# Patient Record
Sex: Female | Born: 1990 | Race: Black or African American | Hispanic: No | Marital: Single | Smoking: Never smoker
Health system: Southern US, Community
[De-identification: ages and names within clinical notes are randomized; demographics above are authoritative.]

## PROBLEM LIST (undated history)

## (undated) DIAGNOSIS — E079 Disorder of thyroid, unspecified: Secondary | ICD-10-CM

## (undated) DIAGNOSIS — N870 Mild cervical dysplasia: Secondary | ICD-10-CM

## (undated) DIAGNOSIS — E349 Endocrine disorder, unspecified: Secondary | ICD-10-CM

## (undated) HISTORY — DX: Disorder of thyroid, unspecified: E07.9

## (undated) HISTORY — DX: Endocrine disorder, unspecified: E34.9

## (undated) HISTORY — DX: Mild cervical dysplasia: N87.0

---

## 2013-01-20 ENCOUNTER — Encounter (HOSPITAL_COMMUNITY): Payer: Self-pay | Admitting: Emergency Medicine

## 2013-01-20 ENCOUNTER — Emergency Department (INDEPENDENT_AMBULATORY_CARE_PROVIDER_SITE_OTHER): Payer: BC Managed Care – PPO

## 2013-01-20 ENCOUNTER — Emergency Department (HOSPITAL_COMMUNITY)
Admission: EM | Admit: 2013-01-20 | Discharge: 2013-01-20 | Disposition: A | Discharge status: Home / Self Care | Payer: BC Managed Care – PPO | Source: Home / Self Care | Attending: Emergency Medicine | Admitting: Emergency Medicine

## 2013-01-20 DIAGNOSIS — S8263XA Displaced fracture of lateral malleolus of unspecified fibula, initial encounter for closed fracture: Secondary | ICD-10-CM

## 2013-01-20 DIAGNOSIS — S8262XA Displaced fracture of lateral malleolus of left fibula, initial encounter for closed fracture: Secondary | ICD-10-CM

## 2013-01-20 NOTE — ED Provider Notes (Signed)
CSN: [id number]     Arrival date & time 01/20/13  4098 History   First MD Initiated Contact with Patient 01/20/13 1056     Chief Complaint  Patient presents with  . Ankle Injury    fell off of significant other's back.  hit ankle on wall   (Consider location/radiation/quality/duration/timing/severity/associated sxs/prior Treatment) HPI Comments: Pt was standing on boyfriend's back, he attempted to get up, her L ankle inverted and she fell, hitting ankle on wall.   Patient is a 22 y.o. female presenting with lower extremity injury. The history is provided by the patient.  Ankle Injury This is a new problem. The current episode started 1 to 2 hours ago. The problem occurs constantly. The problem has not changed since onset.The symptoms are aggravated by walking and standing. Nothing relieves the symptoms. She has tried a cold compress for the symptoms.    History reviewed. No pertinent past medical history. History reviewed. No pertinent past surgical history. History reviewed. No pertinent family history. History  Substance Use Topics  . Smoking status: Not on file  . Smokeless tobacco: Not on file  . Alcohol Use: Not on file   OB History   Grav Para Term Preterm Abortions TAB SAB Ect Mult Living                 Review of Systems  Musculoskeletal: Positive for joint swelling.       L lateral ankle pain  Skin: Negative for color change and wound.  Neurological: Negative for numbness.    Allergies  Review of patient's allergies indicates no known allergies.  Home Medications  No current outpatient prescriptions on file. BP 132/83  Pulse 116  Temp(Src) 99 F (37.2 C) (Oral)  Resp 18  SpO2 100%  LMP 12/31/2012 Physical Exam  Constitutional: She appears well-developed and well-nourished. No distress.  Musculoskeletal:       Left ankle: She exhibits decreased range of motion and swelling. Tenderness. Lateral malleolus tenderness found.  Skin: Skin is warm, dry and  intact.    ED Course  Procedures (including critical care time) Labs Review Labs Reviewed - No data to display Imaging Review Dg Ankle Complete Left  01/20/2013   CLINICAL DATA:  Ankle injury, lateral pain  EXAM: LEFT ANKLE COMPLETE - 3+ VIEW  COMPARISON:  None.  FINDINGS: Acute nondisplaced lateral malleolar avulsion fracture noted. Lateral soft tissue swelling. Normal ankle alignment. No effusion. Left distal tibia, talus and calcaneus intact.  IMPRESSION: Nondisplaced acute lateral malleolar fracture   Electronically Signed   By: Ruel Favors M.D.   On: 01/20/2013 10:37    EKG Interpretation    Date/Time:    Ventricular Rate:    PR Interval:    QRS Duration:   QT Interval:    QTC Calculation:   R Axis:     Text Interpretation:              MDM   1. Avulsion fracture of lateral malleolus of left fibula   pt given aso and crutches, has own ortho to f/u with in [place].      Cathlyn Parsons, NP 01/20/13 1104

## 2013-01-20 NOTE — ED Provider Notes (Signed)
Medical screening examination/treatment/procedure(s) were performed by non-physician practitioner and as supervising physician I was immediately available for consultation/collaboration.  Leslee Home, M.D.   Reuben Likes, MD 01/20/13 2039

## 2013-01-20 NOTE — ED Notes (Signed)
This morning, the pt. fell off of significant other's back, hit ankle on wall.  Left ankle swollen. And painful.  Able to move foot, however it is painful

## 2013-01-20 NOTE — ED Notes (Signed)
C/o left ankle injury this morning States this morning while standing on her friend she fell off and hit ankle on the wall

## 2013-06-10 ENCOUNTER — Encounter: Payer: Self-pay | Admitting: Gynecology

## 2013-06-10 ENCOUNTER — Ambulatory Visit (INDEPENDENT_AMBULATORY_CARE_PROVIDER_SITE_OTHER): Payer: Commercial Managed Care - PPO | Admitting: Gynecology

## 2013-06-10 ENCOUNTER — Other Ambulatory Visit (HOSPITAL_COMMUNITY)
Admission: RE | Admit: 2013-06-10 | Discharge: 2013-06-10 | Disposition: A | Discharge status: Home / Self Care | Payer: Commercial Managed Care - PPO | Source: Ambulatory Visit | Attending: Gynecology | Admitting: Gynecology

## 2013-06-10 VITALS — BP 124/80 | Ht 66.0 in | Wt 156.0 lb

## 2013-06-10 DIAGNOSIS — E0590 Thyrotoxicosis, unspecified without thyrotoxic crisis or storm: Secondary | ICD-10-CM

## 2013-06-10 DIAGNOSIS — Z124 Encounter for screening for malignant neoplasm of cervix: Secondary | ICD-10-CM | POA: Insufficient documentation

## 2013-06-10 DIAGNOSIS — A499 Bacterial infection, unspecified: Secondary | ICD-10-CM

## 2013-06-10 DIAGNOSIS — Z01419 Encounter for gynecological examination (general) (routine) without abnormal findings: Secondary | ICD-10-CM

## 2013-06-10 DIAGNOSIS — Z113 Encounter for screening for infections with a predominantly sexual mode of transmission: Secondary | ICD-10-CM

## 2013-06-10 DIAGNOSIS — R87810 Cervical high risk human papillomavirus (HPV) DNA test positive: Secondary | ICD-10-CM | POA: Insufficient documentation

## 2013-06-10 DIAGNOSIS — Z3189 Encounter for other procreative management: Secondary | ICD-10-CM

## 2013-06-10 DIAGNOSIS — B9689 Other specified bacterial agents as the cause of diseases classified elsewhere: Secondary | ICD-10-CM

## 2013-06-10 DIAGNOSIS — Z1151 Encounter for screening for human papillomavirus (HPV): Secondary | ICD-10-CM | POA: Insufficient documentation

## 2013-06-10 DIAGNOSIS — B379 Candidiasis, unspecified: Secondary | ICD-10-CM

## 2013-06-10 DIAGNOSIS — N760 Acute vaginitis: Secondary | ICD-10-CM

## 2013-06-10 DIAGNOSIS — N898 Other specified noninflammatory disorders of vagina: Secondary | ICD-10-CM

## 2013-06-10 LAB — WET PREP FOR TRICH, YEAST, CLUE: Trich, Wet Prep: NONE SEEN

## 2013-06-10 MED ORDER — CLINDAMYCIN HCL 300 MG PO CAPS: ORAL_CAPSULE | ORAL | Status: DC

## 2013-06-10 MED ORDER — FLUCONAZOLE 150 MG PO TABS
150.0000 mg | ORAL_TABLET | Freq: Once | ORAL | Status: DC
Start: 2013-06-10 — End: 2014-08-25

## 2013-06-10 NOTE — Patient Instructions (Addendum)
Home Tests Home testing is a growing area of health care that places new responsibilities on the health care consumer. If you need additional information or would like to learn about recently approved tests, visit the FDA web page Over-the-Counter In Vitro Diagnostic Devices. Another FDA page, Currently Waived Analytes, includes links to a broad range of tests kits and devices that are identified by brand. Not all are for home use -- those that are will be clearly marked. The following home testing kits are available.  KEY: This key applies to all of the tests listed below  SA=stand-alone test, results available to consumer in the home  OTC=over-the-counter, no prescription needed.  N/A= not applicable Glucose  Condition: Diabetes  Purpose: To monitor blood sugar levels  Format: SA  Availability: OTC, physician recommendation Comments: Home testing for diabetes has advanced from urine testing to whole blood testing. Less invasive or painful procedures, such as alternate site testing meters and those that use infrared technology to "read" blood sugar through the skin, are becoming more common. Cholesterol  Condition: Heart disease  Purpose: Screening for total cholesterol in blood  Format: SA  Availability: OTC Comments: Does not separate out cholesterol. Natural variations in cholesterol may make test results hard to interpret. If results indicate potential heart disease, see your doctor.  Luteinizing Hormone   Condition: Ovulation  Purpose: Predicting time when woman is most likely to be fertile  Format: SA  Availability: OTC Comments: Certain monitoring devices can store information and accessed by the couple and their health provider for retrospective review.  Human Chorionic Gonadotropin (hCG)   Condition: Pregnancy  Purpose: Screening for possibility of pregnancy  Format: SA  Availability: OTC Comments: Results should be confirmed by doctor or laboratory testing   Fecal Occult Blood   Condition: Colo-rectal cancer  Purpose: Screening for blood in the stool  Format: SA  Availability: OTC Comments: Subject to false positives; substances in the body or in your system can affect results.  Prothrombin Time (Coagulation Testing)   Condition: Heart disease, stroke, atherosclerosis  Purpose: Measuring the concentration of blood thinner in the body  Format: SA; portable, battery-operated device.  Availability: By prescription Comments: Relatively new; safety and efficacy are still being evaluated. Requires considerable patient training and compliance to be useful. Cost covered by some insurers.  Drugs of Abuse   Condition: Possible current or past illicit drug use that results in organ systems damage  Purpose: Screens for the possibility that the consumer takes illegal or restricted drugs of abuse  Format: SA; In commercial employer situations, positive results are then referred to laboratories for confirmation.  Availability: OTC Comments: Results could be affected by medications or foods, such as poppy seeds.  HIV Antibody   Condition: Forerunner of AIDS; susceptibility to other sexually transmitted diseases (STD)  Purpose: Screens for infection by HIV, the virus that causes AIDS  Format: Kit; sample sent to lab for analysis.  Availability: OTC Comments: Results available by phone, which protects anonymity. Counseling is required if test is positive and recommended if test is negative.  Hepatitis C   Condition: Liver dysfunction  Purpose: Screens for past or current infection by Hepatitis C.  Format: Kit; sample sent to lab for analysis. Lab runs same tests used by doctors and hospitals to determine whether antibodies to Hepatitis C are present.  Availability: OTC Comments: Like HIV, combines telephone registration/pretest counseling, collection of blood sample, shipping, laboratory testing, telephone results retrieval, and  post-test counseling. The FDA continuously updates  its list of tests approved and cleared for marketing. For example, recent approvals include a monitoring test for patients who have been diagnosed for bladder cancer, a screening test for blood in urine, etc. Document Released: 02/27/2004 Document Revised: 05/21/2012 Document Reviewed: 01/24/2005 [facility] Patient Information 2014 [place]. Bacterial Vaginosis Bacterial vaginosis is a vaginal infection that occurs when the normal balance of bacteria in the vagina is disrupted. It results from an overgrowth of certain bacteria. This is the most common vaginal infection in women of childbearing age. Treatment is important to prevent complications, especially in pregnant women, as it can cause a premature delivery. CAUSES  Bacterial vaginosis is caused by an increase in harmful bacteria that are normally present in smaller amounts in the vagina. Several different kinds of bacteria can cause bacterial vaginosis. However, the reason that the condition develops is not fully understood. RISK FACTORS Certain activities or behaviors can put you at an increased risk of developing bacterial vaginosis, including:  Having a new sex partner or multiple sex partners.  Douching.  Using an intrauterine device (IUD) for contraception. Women do not get bacterial vaginosis from toilet seats, bedding, swimming pools, or contact with objects around them. SIGNS AND SYMPTOMS  Some women with bacterial vaginosis have no signs or symptoms. Common symptoms include:  Grey vaginal discharge.  A fishlike odor with discharge, especially after sexual intercourse.  Itching or burning of the vagina and vulva.  Burning or pain with urination. DIAGNOSIS  Your health care provider will take a medical history and examine the vagina for signs of bacterial vaginosis. A sample of vaginal fluid may be taken. Your health care provider will look at this sample under a  microscope to check for bacteria and abnormal cells. A vaginal pH test may also be done.  TREATMENT  Bacterial vaginosis may be treated with antibiotic medicines. These may be given in the form of a pill or a vaginal cream. A second round of antibiotics may be prescribed if the condition comes back after treatment.  HOME CARE INSTRUCTIONS   Only take over-the-counter or prescription medicines as directed by your health care provider.  If antibiotic medicine was prescribed, take it as directed. Make sure you finish it even if you start to feel better.  Do not have sex until treatment is completed.  Tell all sexual partners that you have a vaginal infection. They should see their health care provider and be treated if they have problems, such as a mild rash or itching.  Practice safe sex by using condoms and only having one sex partner. SEEK MEDICAL CARE IF:   Your symptoms are not improving after 3 days of treatment.  You have increased discharge or pain.  You have a fever. MAKE SURE YOU:   Understand these instructions.  Will watch your condition.  Will get help right away if you are not doing well or get worse. FOR MORE INFORMATION  Centers for Disease Control and Prevention, Division of STD Prevention: AppraiserFraud.fi American Sexual Health Association (ASHA): www.ashastd.org  Document Released: 01/24/2005 Document Revised: 11/14/2012 Document Reviewed: 09/05/2012 [facility] Patient Information 2014 [place].

## 2013-06-10 NOTE — Progress Notes (Signed)
Krystal [name] December 05, 1990 [id number]   History:    23 y.o.  for annual gyn exam who is a new patient to the practice. Patient's last gynecological exam was in another facility in [place] 2004. Patient states that she has never had any abnormal Pap smears in the past. She is being followed by the endocrinologist Dr. Bubba Camp for hyperthyroidism for which she is currently on PTU and is scheduled to see her next week. All her lab work have been drawn except for fasting lipid profile and blood sugar screen and urinalysis. She freely does self breast examination. She's been trying to get pregnant over the past 4 months. She reports normal cycles of 4-5 days occurring every 28 days.. patient was complaining of a vaginal discharge and stated she's had issues with BV in the past. Patient denies any past history of STDs or ever having received the HPV vaccine.  Past medical history,surgical history, family history and social history were all reviewed and documented in the EPIC chart.  Gynecologic History Patient's last menstrual period was 05/19/2013. Contraception: none Last Pap: 3 years ago. Results were: normal Last mammogram: Not indicated. Results were: Not indicated  Obstetric History OB History  Gravida Para Term Preterm AB SAB TAB Ectopic Multiple Living  0                  ROS: A ROS was performed and pertinent positives and negatives are included in the history.  GENERAL: No fevers or chills. HEENT: No change in vision, no earache, sore throat or sinus congestion. NECK: No pain or stiffness. CARDIOVASCULAR: No chest pain or pressure. No palpitations. PULMONARY: No shortness of breath, cough or wheeze. GASTROINTESTINAL: No abdominal pain, nausea, vomiting or diarrhea, melena or bright red blood per rectum. GENITOURINARY: No urinary frequency, urgency, hesitancy or dysuria. MUSCULOSKELETAL: No joint or muscle pain, no back pain, no recent trauma. DERMATOLOGIC: No rash, no itching,  no lesions. ENDOCRINE: No polyuria, polydipsia, no heat or cold intolerance. No recent change in weight. HEMATOLOGICAL: No anemia or easy bruising or bleeding. NEUROLOGIC: No headache, seizures, numbness, tingling or weakness. PSYCHIATRIC: No depression, no loss of interest in normal activity or change in sleep pattern.     Exam: chaperone present  BP 124/80  Ht 5' 6"  (1.676 m)  Wt 156 lb (70.761 kg)  BMI 25.19 kg/m2  LMP 05/19/2013  Body mass index is 25.19 kg/(m^2).  General appearance : Well developed well nourished female. No acute distress HEENT: Diffusely enlarged thyroid gland and no bruits Lungs: Clear to auscultation, no rhonchi or wheezes, or rib retractions  Heart: Regular rate and rhythm, no murmurs or gallops Breast:Examined in sitting and supine position were symmetrical in appearance, no palpable masses or tenderness,  no skin retraction, no nipple inversion, no nipple discharge, no skin discoloration, no axillary or supraclavicular lymphadenopathy Abdomen: no palpable masses or tenderness, no rebound or guarding Extremities: no edema or skin discoloration or tenderness  Pelvic:  Bartholin, Urethra, Skene Glands: Within normal limits             Vagina: No gross lesions or discharge, white discharge  Cervix: No gross lesions or discharge  Uterus  anteverted, normal size, shape and consistency, non-tender and mobile  Adnexa  Without masses or tenderness  Anus and perineum  normal   Rectovaginal  normal sphincter tone without palpated masses or tenderness             Hemoccult that indicated   Wet  prep: A few yeast Moderate clue cells, tumors to count bacteria, positive amine  Assessment/Plan:  23 y.o. female for annual exam who recently began attempting to conceive. Patient with normal menstrual cycles. We discussed using ovulation predictor kit for timing of intercourse. Also we discussed starting prenatal vitamins for neural tube defect prophylaxis. The patient has  not conceived within a year we'll proceed then with infertility evaluation. For her yeast infection she'll be calling a tablet of Diflucan and for her bacterial vaginosis we'll call and clindamycin 300 mg one by mouth twice a day for 7 days in the event of her being pregnant. 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 She will followup with her endocrinologist in reference to her hyperthyroidism for which she is currently on PTU. Fasting lipid profile, comprehensive metabolic panel, and urinalysis were obtained today. Pap smear was done today with no HPV according to the guidelines.  Note: This dictation was prepared with  Dragon/digital dictation along withSmart phrase technology. Any transcriptional errors that result from this process are unintentional.   Terrance Mass MD, 12:33 PM 06/10/2013

## 2013-06-11 LAB — LIPID PANEL
Cholesterol: 163 mg/dL (ref 0–200)
HDL: 60 mg/dL (ref >39)
LDL CALC: 91 mg/dL (ref 0–99)
Total CHOL/HDL Ratio: 2.7 Ratio
Triglycerides: 61 mg/dL (ref <150)
VLDL: 12 mg/dL (ref 0–40)

## 2013-06-11 LAB — COMPREHENSIVE METABOLIC PANEL
ALBUMIN: 4.4 g/dL (ref 3.5–5.2)
AST: 10 U/L (ref 0–37)
Alkaline Phosphatase: 88 U/L (ref 39–117)
BUN: 9 mg/dL (ref 6–23)
CO2: 26 mEq/L (ref 19–32)
Calcium: 9.5 mg/dL (ref 8.4–10.5)
Chloride: 105 mEq/L (ref 96–112)
Creat: 0.73 mg/dL (ref 0.50–1.10)
Glucose, Bld: 83 mg/dL (ref 70–99)
POTASSIUM: 4.2 meq/L (ref 3.5–5.3)
SODIUM: 137 meq/L (ref 135–145)
Total Bilirubin: 0.6 mg/dL (ref 0.2–1.2)
Total Protein: 7.3 g/dL (ref 6.0–8.3)

## 2013-06-11 LAB — GC/CHLAMYDIA PROBE AMP
CT Probe RNA: NEGATIVE
GC Probe RNA: NEGATIVE

## 2013-07-15 ENCOUNTER — Ambulatory Visit: Payer: Commercial Managed Care - PPO | Admitting: Gynecology

## 2013-08-12 ENCOUNTER — Ambulatory Visit: Payer: Commercial Managed Care - PPO | Admitting: Gynecology

## 2013-08-19 ENCOUNTER — Other Ambulatory Visit: Payer: Self-pay | Admitting: Gynecology

## 2013-08-19 ENCOUNTER — Ambulatory Visit (INDEPENDENT_AMBULATORY_CARE_PROVIDER_SITE_OTHER): Payer: Commercial Managed Care - PPO | Admitting: Gynecology

## 2013-08-19 ENCOUNTER — Encounter: Payer: Self-pay | Admitting: Gynecology

## 2013-08-19 VITALS — BP 118/76

## 2013-08-19 DIAGNOSIS — R6889 Other general symptoms and signs: Secondary | ICD-10-CM

## 2013-08-19 DIAGNOSIS — IMO0002 Reserved for concepts with insufficient information to code with codable children: Secondary | ICD-10-CM

## 2013-08-19 NOTE — Patient Instructions (Signed)
Colposcopy Colposcopy is a procedure to examine your cervix and vagina, or the area around the outside of your vagina, for abnormalities or signs of disease. The procedure is done using a lighted microscope called a colposcope. Tissue samples may be collected during the colposcopy if your health care provider finds any unusual cells. A colposcopy may be done if a woman has:  An abnormal Pap test. A Pap test is a medical test done to evaluate cells that are on the surface of the cervix.  A Pap test result that is suggestive of human papillomavirus (HPV). This virus can cause genital warts and is linked to the development of cervical cancer.  A sore on her cervix and the results of a Pap test were normal.  Genital warts on the cervix or in or around the outside of the vagina.  A mother who took the drug diethylstilbestrol (DES) while pregnant.  Painful intercourse.  Vaginal bleeding, especially after sexual intercourse. LET YOUR HEALTH CARE PROVIDER KNOW ABOUT:  Any allergies you have.  All medicines you are taking, including vitamins, herbs, eye drops, creams, and over-the-counter medicines.  Previous problems you or members of your family have had with the use of anesthetics.  Any blood disorders you have.  Previous surgeries you have had.  Medical conditions you have. RISKS AND COMPLICATIONS Generally, a colposcopy is a safe procedure. However, as with any procedure, complications can occur. Possible complications include:  Bleeding.  Infection.  Missed lesions. BEFORE THE PROCEDURE   Tell your health care provider if you have your menstrual period. A colposcopy typically is not done during menstruation.  For 24 hours before the colposcopy, do not:  Douche.  Use tampons.  Use medicines, creams, or suppositories in the vagina.  Have sexual intercourse. PROCEDURE  During the procedure, you will be lying on your back with your feet in foot rests (stirrups). A warm  metal or plastic instrument (speculum) will be placed in your vagina to keep it open and to allow the health care provider to see the cervix. The colposcope will be placed outside the vagina. It will be used to magnify and examine the cervix, vagina, and the area around the outside of the vagina. A small amount of liquid solution will be placed on the area that is to be viewed. This solution will make it easier to see the abnormal cells. Your health care provider will use tools to suck out mucus and cells from the canal of the cervix. Then he or she will record the location of the abnormal areas. If a biopsy is done during the procedure, a medicine will usually be given to numb the area (local anesthetic). You may feel mild pain or cramping while the biopsy is done. After the procedure, tissue samples collected during the biopsy will be sent to a lab for analysis. AFTER THE PROCEDURE  You will be given instructions on when to follow up with your health care provider for your test results. It is important to keep your appointment. Document Released: 04/16/2002 Document Revised: 09/26/2012 Document Reviewed: 08/23/2012 [employer] Patient Information 2015 [employer]. This information is not intended to replace advice given to you by your health care provider. Make sure you discuss any questions you have with your health care provider.  

## 2013-08-19 NOTE — Progress Notes (Signed)
   [age] year old who was seen in the office as a new patient on May 4 for her annual exam and is here today for colposcopic evaluation as a result of her recent Pap smear. See previous note for detail information on medical history. Pap smear recently demonstrated the following which were shared with the patient:  ATYPICAL SQUAMOUS CELLS OF UNDETERMINED SIGNIFICANCE (ASC-US). HPV 16,18/45 Genotyping NEGATIVE for HPV 16 & 18/45 Completed by [id number] on 2013-06-17 HPV High Risk **DETECTED** Completed by GB on 2013-06-13  Patient was counseled for a detailed colposcopic evaluation findings as noted below:  Physical Exam  Genitourinary:     Patient underwent a detail colposcopy evaluation of the external genitalia, perineum, perirectal region with no lesions seen. The speculum was introduced into the vaginal region no vaginal lesions were noted only on the cervix at the 1:00 position a flat acetowhite area was noted after applying acetic acid. This area was biopsied. An endocervical speculum demonstrated a normal-appearing transformation zone and following this an ECC was obtained. Silver nitrate was used for hemostasis at the biopsy site.  Assessment/plan: 23 year old with atypical squamous cells of undetermined significance on Pap smear with high-risk HPV testing the wound genotype for 16, 18, and 45 were tested it was negative. Biopsy from the 1:00 position the ectocervix acetowhite area was obtained along with ECC and submitted for histological evaluation. Pathology report pending at time of this dictation. Literature and information on abnormal Pap smear and explanation of management was discussed in detail and all questions were answered.

## 2014-08-18 ENCOUNTER — Encounter: Payer: Commercial Managed Care - PPO | Admitting: Gynecology

## 2014-08-25 ENCOUNTER — Other Ambulatory Visit (HOSPITAL_COMMUNITY)
Admission: RE | Admit: 2014-08-25 | Discharge: 2014-08-25 | Disposition: A | Discharge status: Home / Self Care | Payer: Commercial Managed Care - PPO | Source: Ambulatory Visit | Attending: Gynecology | Admitting: Gynecology

## 2014-08-25 ENCOUNTER — Ambulatory Visit (INDEPENDENT_AMBULATORY_CARE_PROVIDER_SITE_OTHER): Payer: Commercial Managed Care - PPO | Admitting: Gynecology

## 2014-08-25 ENCOUNTER — Encounter: Payer: Self-pay | Admitting: Gynecology

## 2014-08-25 VITALS — BP 120/80 | Ht 65.5 in | Wt 165.8 lb

## 2014-08-25 DIAGNOSIS — Z01411 Encounter for gynecological examination (general) (routine) with abnormal findings: Secondary | ICD-10-CM | POA: Diagnosis not present

## 2014-08-25 DIAGNOSIS — Z01419 Encounter for gynecological examination (general) (routine) without abnormal findings: Secondary | ICD-10-CM

## 2014-08-25 DIAGNOSIS — N870 Mild cervical dysplasia: Secondary | ICD-10-CM

## 2014-08-25 DIAGNOSIS — Z1151 Encounter for screening for human papillomavirus (HPV): Secondary | ICD-10-CM | POA: Insufficient documentation

## 2014-08-25 DIAGNOSIS — Z113 Encounter for screening for infections with a predominantly sexual mode of transmission: Secondary | ICD-10-CM

## 2014-08-25 DIAGNOSIS — R87810 Cervical high risk human papillomavirus (HPV) DNA test positive: Secondary | ICD-10-CM | POA: Insufficient documentation

## 2014-08-25 LAB — CBC WITH DIFFERENTIAL/PLATELET
BASOS PCT: 0 % (ref 0–1)
Basophils Absolute: 0 10*3/uL (ref 0.0–0.1)
EOS PCT: 1 % (ref 0–5)
Eosinophils Absolute: 0.1 10*3/uL (ref 0.0–0.7)
HCT: 38 % (ref 36.0–46.0)
HEMOGLOBIN: 12.6 g/dL (ref 12.0–15.0)
Lymphocytes Relative: 29 % (ref 12–46)
Lymphs Abs: 2 10*3/uL (ref 0.7–4.0)
MCH: 29.8 pg (ref 26.0–34.0)
MCHC: 33.2 g/dL (ref 30.0–36.0)
MCV: 89.8 fL (ref 78.0–100.0)
MONO ABS: 0.4 10*3/uL (ref 0.1–1.0)
MPV: 9.5 fL (ref 8.6–12.4)
Monocytes Relative: 6 % (ref 3–12)
NEUTROS ABS: 4.4 10*3/uL (ref 1.7–7.7)
Neutrophils Relative %: 64 % (ref 43–77)
Platelets: 333 10*3/uL (ref 150–400)
RBC: 4.23 MIL/uL (ref 3.87–5.11)
RDW: 13 % (ref 11.5–15.5)
WBC: 6.9 10*3/uL (ref 4.0–10.5)

## 2014-08-25 LAB — LIPID PANEL
CHOLESTEROL: 127 mg/dL (ref 0–200)
HDL: 57 mg/dL (ref >=46)
LDL Cholesterol: 55 mg/dL (ref 0–99)
Total CHOL/HDL Ratio: 2.2 Ratio
Triglycerides: 75 mg/dL (ref <150)
VLDL: 15 mg/dL (ref 0–40)

## 2014-08-25 LAB — HEPATITIS C ANTIBODY: HCV Ab: NEGATIVE

## 2014-08-25 LAB — HIV ANTIBODY (ROUTINE TESTING W REFLEX): HIV: NONREACTIVE

## 2014-08-25 LAB — HEPATITIS B SURFACE ANTIGEN: Hepatitis B Surface Ag: NEGATIVE

## 2014-08-25 NOTE — Addendum Note (Signed)
Addended by: Richardson ChiquitoWILKINSON, KARI S on: 08/25/2014 04:49 PM   Modules accepted: Orders

## 2014-08-25 NOTE — Progress Notes (Signed)
Krystal [name] August 08, 1990 [id number]   History:    24 y.o.  for annual gyn exam who was seen in the office in July 2015 for colposcopy and biopsy as a result of her Pap smear which had demonstrated the following: ATYPICAL SQUAMOUS CELLS OF UNDETERMINED SIGNIFICANCE (ASC-US). HPV 16,18/45 Genotyping NEGATIVE for HPV 16 & 18/45 Completed by [id number] on 2013-06-17 HPV High Risk **DETECTED** Completed by GB on 2013-06-13  She subsequently underwent colposcopy and biopsy and pathology report had demonstrated the following: Diagnosis 1. Endocervix, curettage - DETACHED SUPERFICIAL FRAGMENTS OF SQUAMOUS MUCOSA WITH KOILOCYTIC ATYPIA. 2. Cervix, biopsy, 1 o'clock - LOW GRADE SQUAMOUS INTRAEPITHELIAL LESION, CIN-I (MILD DYSPLASIA). Microscopic Comment 1. 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 (BNS:kh 08-20-13) 2. The findings correlate with the changes seen in the most recent pap test  Dr. Lurene ShadowBallen has been treating patient for her hyperthyroidism for which she is currently on PTU . Patient interested in an STD screen today. She was asymptomatic. She is using condoms for contraception. Patient refused HPV vaccine. Patient reports normal menstrual cycles   Past medical history,surgical history, family history and social history were all reviewed and documented in the EPIC chart.  Gynecologic History Patient's last menstrual period was 08/19/2014 (exact date). Contraception: condoms Last Pap: See above. Results were: See above Last mammogram: Not indicated. Results were: Not indicated  Obstetric History OB History  Gravida Para Term Preterm AB SAB TAB Ectopic Multiple Living  0                  ROS: A ROS was performed and pertinent positives and negatives are included in the history.  GENERAL: No fevers or chills. HEENT: No change in vision, no earache, sore throat or sinus congestion. NECK: No pain or stiffness. CARDIOVASCULAR: No chest pain or  pressure. No palpitations. PULMONARY: No shortness of breath, cough or wheeze. GASTROINTESTINAL: No abdominal pain, nausea, vomiting or diarrhea, melena or bright red blood per rectum. GENITOURINARY: No urinary frequency, urgency, hesitancy or dysuria. MUSCULOSKELETAL: No joint or muscle pain, no back pain, no recent trauma. DERMATOLOGIC: No rash, no itching, no lesions. ENDOCRINE: No polyuria, polydipsia, no heat or cold intolerance. No recent change in weight. HEMATOLOGICAL: No anemia or easy bruising or bleeding. NEUROLOGIC: No headache, seizures, numbness, tingling or weakness. PSYCHIATRIC: No depression, no loss of interest in normal activity or change in sleep pattern.     Exam: chaperone present  BP 120/80 mmHg  Ht 5' 5.5" (1.664 m)  Wt 165 lb 12.8 oz (75.206 kg)  BMI 27.16 kg/m2  LMP 08/19/2014 (Exact Date)  Body mass index is 27.16 kg/(m^2).  General appearance : Well developed well nourished female. No acute distress HEENT: Eyes: no retinal hemorrhage or exudates,  Neck supple, trachea midline, no carotid bruits, thyroidmegaly Lungs: Clear to auscultation, no rhonchi or wheezes, or rib retractions  Heart: Regular rate and rhythm, no murmurs or gallops Breast:Examined in sitting and supine position were symmetrical in appearance, no palpable masses or tenderness,  no skin retraction, no nipple inversion, no nipple discharge, no skin discoloration, no axillary or supraclavicular lymphadenopathy Abdomen: no palpable masses or tenderness, no rebound or guarding Extremities: no edema or skin discoloration or tenderness  Pelvic:  Bartholin, Urethra, Skene Glands: Within normal limits             Vagina: No gross lesions or discharge  Cervix: Absent  Uterus  absent  Adnexa  Without masses or tenderness  Anus and  perineum  normal   Rectovaginal  normal sphincter tone without palpated masses or tenderness             Hemoccult not indicated     Assessment/Plan:  24 y.o. female for  annual exam who is being followed by Dr. Lurene Shadow is aware of her thyromegaly and is under treatment with PTU recent blood work levels according to patient were normal. Patient today we'll have the following screening labs: CBC, fasting lipid profile along with urinalysis. Pap smear with HPV screen done today due to the fact that she had ascus and subsequent cervical biopsy demonstrated CIN-1 positive HPV but negative for HPV 16, 18 and 45. We discussed importance of monthly self breast examination she is using condoms for contraception but recommended that she take a prenatal vitamin the event that she were to conceive spontaneously. Patient reports normal menstrual cycles. We discussed importance of monthly breast exam.   Ok Edwards MD, 10:17 AM 08/25/2014

## 2014-08-26 LAB — URINALYSIS W MICROSCOPIC + REFLEX CULTURE
Bacteria, UA: NONE SEEN
Bilirubin Urine: NEGATIVE
CASTS: NONE SEEN
Crystals: NONE SEEN
Glucose, UA: NEGATIVE mg/dL
HGB URINE DIPSTICK: NEGATIVE
KETONES UR: NEGATIVE mg/dL
Leukocytes, UA: NEGATIVE
NITRITE: NEGATIVE
PH: 5.5 (ref 5.0–8.0)
Protein, ur: NEGATIVE mg/dL
Specific Gravity, Urine: 1.015 (ref 1.005–1.030)
UROBILINOGEN UA: 0.2 mg/dL (ref 0.0–1.0)

## 2014-08-26 LAB — RPR

## 2014-08-26 LAB — GC/CHLAMYDIA PROBE AMP
CT Probe RNA: NEGATIVE
GC Probe RNA: NEGATIVE

## 2014-08-27 LAB — CYTOLOGY - PAP

## 2014-08-28 ENCOUNTER — Telehealth: Payer: Self-pay | Admitting: *Deleted

## 2014-08-28 NOTE — Telephone Encounter (Signed)
Pt informed with negative STD screening on 08/25/14

## 2014-10-27 ENCOUNTER — Ambulatory Visit (INDEPENDENT_AMBULATORY_CARE_PROVIDER_SITE_OTHER): Payer: Commercial Managed Care - PPO | Admitting: Gynecology

## 2014-10-27 ENCOUNTER — Encounter: Payer: Self-pay | Admitting: Gynecology

## 2014-10-27 VITALS — BP 120/84

## 2014-10-27 DIAGNOSIS — B977 Papillomavirus as the cause of diseases classified elsewhere: Secondary | ICD-10-CM

## 2014-10-27 DIAGNOSIS — R8789 Other abnormal findings in specimens from female genital organs: Secondary | ICD-10-CM | POA: Diagnosis not present

## 2014-10-27 DIAGNOSIS — R87618 Other abnormal cytological findings on specimens from cervix uteri: Secondary | ICD-10-CM

## 2014-10-27 NOTE — Progress Notes (Signed)
   Patient is a 24 year old who presented to the office today as a result of her recent abnormal Pap smear at time of her annual exam this year. Her Pap smear demonstrated the following:   Negative for intraepithelial lesions or malignancy  Molecular Results Test Result HPV 16,18/45 Genotyping NEGATIVE FOR HPV 16 & 18/45 HPV High Risk DETECTED  Review of patient's record indicated that last year her Pap smear and colposcopic directed biopsy demonstrated the following:  ATYPICAL SQUAMOUS CELLS OF UNDETERMINED SIGNIFICANCE (ASC-US). HPV 16,18/45 Genotyping NEGATIVE for HPV 16 & 18/45 Completed by [id number] on 2013-06-17 HPV High Risk **DETECTED** Completed by GB on 2013-06-13  Patient was counseled for a detailed colposcopic evaluation findings as noted below:  Physical Exam  Genitourinary:     Patient underwent a detail colposcopy evaluation of the external genitalia, perineum, perirectal region with no lesions seen. The speculum was introduced into the vaginal region no vaginal lesions were noted only on the cervix at the 1:00 position a flat acetowhite area was noted after applying acetic acid. This area was biopsied. An endocervical speculum demonstrated a normal-appearing transformation zone and following this an ECC was obtained.   Her biopsy report demonstrated the following: Diagnosis 1. Endocervix, curettage - DETACHED SUPERFICIAL FRAGMENTS OF SQUAMOUS MUCOSA WITH KOILOCYTIC ATYPIA. 2. Cervix, biopsy, 1 o'clock - LOW GRADE SQUAMOUS INTRAEPITHELIAL LESION, CIN-I (MILD DYSPLASIA).  Today she underwent detail colposcopic evaluation. The external genitalia, perineum, and perirectal region were inspected and no abnormalities were noted. The speculum was introduced into the vagina. A systematic inspection the vagina cuff and cervix did not demonstrate any lesions seen and after applying acetic acid. Endocervical speculum was utilized. The transformation zone was visualized entirely  and an ECC was obtained.  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 High-risk HPV was noted but negative for 16, 18 and 45. We'll wait for results of ECC if negative will proceed with Pap smear and HPV screening next year. Literature information was provided for patient to consider the HPV vaccine series this year. She is using condoms for contraception.

## 2014-10-27 NOTE — Patient Instructions (Signed)
HPV Vaccine Gardasil (Human Papillomavirus): What You Need to Know 1. What is HPV? Genital human papillomavirus (HPV) is the most common sexually transmitted virus in the [place]. More than half of sexually active men and women are infected with HPV at some time in their lives. About 20 million Americans are currently infected, and about 6 million more get infected each year. HPV is usually spread through sexual contact. Most HPV infections don't cause any symptoms, and go away on their own. But HPV can cause cervical cancer in women. Cervical cancer is the 2nd leading cause of cancer deaths among women around the world. In the [place], about 12,000 women get cervical cancer every year and about 4,000 are expected to die from it. HPV is also associated with several less common cancers, such as vaginal and vulvar cancers in women, and anal and oropharyngeal (back of the throat, including base of tongue and tonsils) cancers in both men and women. HPV can also cause genital warts and warts in the throat. There is no cure for HPV infection, but some of the problems it causes can be treated. 2. HPV vaccine: Why get vaccinated? The HPV vaccine you are getting is one of two vaccines that can be given to prevent HPV. It may be given to both males and females.  This vaccine can prevent most cases of cervical cancer in females, if it is given before exposure to the virus. In addition, it can prevent vaginal and vulvar cancer in females, and genital warts and anal cancer in both males and females. Protection from HPV vaccine is expected to be long-lasting. But vaccination is not a substitute for cervical cancer screening. Women should still get regular Pap tests. 3. Who should get this HPV vaccine and when? HPV vaccine is given as a 3-dose series  1st Dose: Now  2nd Dose: 1 to 2 months after Dose 1  3rd Dose: 6 months after Dose 1 Additional (booster) doses are not recommended. Routine  vaccination  This HPV vaccine is recommended for girls and boys 11 or 26 years of age. It may be given starting at age 9. Why is HPV vaccine recommended at 11 or 26 years of age?  HPV infection is easily acquired, even with only one sex partner. That is why it is important to get HPV vaccine before any sexual contact takes place. Also, response to the vaccine is better at this age than at older ages. Catch-up vaccination This vaccine is recommended for the following people who have not completed the 3-dose series:   Females 13 through 26 years of age.  Males 13 through 26 years of age. This vaccine may be given to men 22 through 26 years of age who have not completed the 3-dose series. It is recommended for men through age 26 who have sex with men or whose immune system is weakened because of HIV infection, other illness, or medications.  HPV vaccine may be given at the same time as other vaccines. 4. Some people should not get HPV vaccine or should wait.  Anyone who has ever had a life-threatening allergic reaction to any component of HPV vaccine, or to a previous dose of HPV vaccine, should not get the vaccine. Tell your doctor if the person getting vaccinated has any severe allergies, including an allergy to yeast.  HPV vaccine is not recommended for pregnant women. However, receiving HPV vaccine when pregnant is not a reason to consider terminating the pregnancy. Women who are breast   feeding may get the vaccine.  People who are mildly ill when a dose of HPV is planned can still be vaccinated. People with a moderate or severe illness should wait until they are better. 5. What are the risks from this vaccine? This HPV vaccine has been used in the [place] and around the world for about six years and has been very safe. However, any medicine could possibly cause a serious problem, such as a severe allergic reaction. The risk of any vaccine causing a serious injury, or death, is extremely  small. Life-threatening allergic reactions from vaccines are very rare. If they do occur, it would be within a few minutes to a few hours after the vaccination. Several mild to moderate problems are known to occur with this HPV vaccine. These do not last long and go away on their own.  Reactions in the arm where the shot was given:  Pain (about 8 people in 10)  Redness or swelling (about 1 person in 4)  Fever:  Mild (100 F) (about 1 person in 10)  Moderate (102 F) (about 1 person in 44)  Other problems:  Headache (about 1 person in 3)  Fainting: Brief fainting spells and related symptoms (such as jerking movements) can happen after any medical procedure, including vaccination. Sitting or lying down for about 15 minutes after a vaccination can help prevent fainting and injuries caused by falls. Tell your doctor if the patient feels dizzy or light-headed, or has vision changes or ringing in the ears.  Like all vaccines, HPV vaccines will continue to be monitored for unusual or severe problems. 6. What if there is a serious reaction? What should I look for?  Look for anything that concerns you, such as signs of a severe allergic reaction, very high fever, or behavior changes. Signs of a severe allergic reaction can include hives, swelling of the face and throat, difficulty breathing, a fast heartbeat, dizziness, and weakness. These would start a few minutes to a few hours after the vaccination.  What should I do?  If you think it is a severe allergic reaction or other emergency that can't wait, call 9-1-1 or get the person to the nearest hospital. Otherwise, call your doctor.  Afterward, the reaction should be reported to the Vaccine Adverse Event Reporting System (VAERS). Your doctor might file this report, or you can do it yourself through the VAERS web site at www.vaers.LAgents.no, or by calling 1-(228) 374-8142. VAERS is only for reporting reactions. They do not give medical  advice. 7. The National Vaccine Injury Compensation Program  The [employer] Vaccine Injury Compensation Program (VICP) is a federal program that was created to compensate people who may have been injured by certain vaccines.  Persons who believe they may have been injured by a vaccine can learn about the program and about filing a claim by calling 1-(228) 742-7878 or visiting the VICP website at SpiritualWord.at. 8. How can I learn more?  Ask your doctor.  Call your local or state health department.  Contact the Centers for Disease Control and Prevention (CDC):  Call 228-879-4471 (1-800-CDC-INFO)  or  Visit CDC's website at PicCapture.uy CDC Human Papillomavirus (HPV) Gardasil (Interim) 06/24/11 Document Released: 11/21/2005 Document Revised: 06/10/2013 Document Reviewed: 03/07/2013 ExitCare Patient Information 2015 [place], [place]. This information is not intended to replace advice given to you by your health care provider. Make sure you discuss any questions you have with your health care provider. Colposcopy Care After Colposcopy is a procedure in which a special tool  is used to magnify the surface of the cervix. A tissue sample (biopsy) may also be taken. This sample will be looked at for cervical cancer or other problems. After the test:  You may have some cramping.  Lie down for a few minutes if you feel lightheaded.   You may have some bleeding which should stop in a few days. HOME CARE  Do not have sex or use tampons for 2 to 3 days or as told.  Only take medicine as told by your doctor.  Continue to take your birth control pills as usual. Finding out the results of your test Ask when your test results will be ready. Make sure you get your test results. GET HELP RIGHT AWAY IF:  You are bleeding a lot or are passing blood clots.  You develop a fever of 102 F (38.9 C) or higher.  You have abnormal vaginal discharge.  You have cramps that do  not go away with medicine.  You feel lightheaded, dizzy, or pass out (faint). MAKE SURE YOU:   Understand these instructions.  Will watch your condition.  Will get help right away if you are not doing well or get worse. Document Released: 07/13/2007 Document Revised: 04/18/2011 Document Reviewed: 08/23/2012 [facility] Patient Information 2015 [place], [place]. This information is not intended to replace advice given to you by your health care provider. Make sure you discuss any questions you have with your health care provider.

## 2015-01-05 ENCOUNTER — Encounter: Payer: Self-pay | Admitting: Women's Health

## 2015-01-05 ENCOUNTER — Ambulatory Visit (INDEPENDENT_AMBULATORY_CARE_PROVIDER_SITE_OTHER): Payer: Commercial Managed Care - PPO | Admitting: Women's Health

## 2015-01-05 VITALS — BP 128/80 | Ht 65.0 in | Wt 165.0 lb

## 2015-01-05 DIAGNOSIS — Z113 Encounter for screening for infections with a predominantly sexual mode of transmission: Secondary | ICD-10-CM | POA: Diagnosis not present

## 2015-01-05 DIAGNOSIS — Z23 Encounter for immunization: Secondary | ICD-10-CM | POA: Diagnosis not present

## 2015-01-05 DIAGNOSIS — R350 Frequency of micturition: Secondary | ICD-10-CM | POA: Diagnosis not present

## 2015-01-05 DIAGNOSIS — N898 Other specified noninflammatory disorders of vagina: Secondary | ICD-10-CM

## 2015-01-05 LAB — WET PREP FOR TRICH, YEAST, CLUE
Clue Cells Wet Prep HPF POC: NONE SEEN
TRICH WET PREP: NONE SEEN
WBC, Wet Prep HPF POC: NONE SEEN
Yeast Wet Prep HPF POC: NONE SEEN

## 2015-01-05 LAB — URINALYSIS W MICROSCOPIC + REFLEX CULTURE
Bacteria, UA: NONE SEEN [HPF]
Bilirubin Urine: NEGATIVE
CASTS: NONE SEEN [LPF]
CRYSTALS: NONE SEEN [HPF]
Glucose, UA: NEGATIVE
KETONES UR: NEGATIVE
Leukocytes, UA: NEGATIVE
Nitrite: NEGATIVE
PH: 5.5 (ref 5.0–8.0)
Protein, ur: NEGATIVE
SPECIFIC GRAVITY, URINE: 1.02 (ref 1.001–1.035)
WBC, UA: NONE SEEN WBC/HPF (ref <=5)
YEAST: NONE SEEN [HPF]

## 2015-01-05 NOTE — Progress Notes (Signed)
Patient ID: Krystal [name], female   DOB: 11/30/1990, 24 y.o.   MRN: [id number] Presents with complaint of vaginal irritation with intercourse and questions if she has a yeast infection.  Cycle started today, monthly cycle condoms or withdrawal. Same partner. Requests STD screen. Denies urinary symptoms, abdominal pain, fever.  Exam: Appears well. External genitalia within normal limits, speculum exam moderate amount of menses type blood, no erythema noted, wet prep negative. GC/Chlamydia culture taken. Bimanual no CMT or adnexal tenderness. UA: Negative  Mild dyspareunia STD screen  Plan: Contraception options reviewed and declined will continue condoms and withdrawal. Reports pregnancy okay. Continue MVI daily. GC/Chlamydia culture pending, HIV, hep B, C, RPR. Encouraged vaginal lubricants with intercourse. 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

## 2015-01-06 LAB — HEPATITIS B SURFACE ANTIGEN: Hepatitis B Surface Ag: NEGATIVE

## 2015-01-06 LAB — HEPATITIS C ANTIBODY: HCV Ab: NEGATIVE

## 2015-01-06 LAB — HIV ANTIBODY (ROUTINE TESTING W REFLEX): HIV: NONREACTIVE

## 2015-01-06 LAB — RPR

## 2015-01-07 LAB — GC/CHLAMYDIA PROBE AMP
CT PROBE, AMP APTIMA: NEGATIVE
GC PROBE AMP APTIMA: NEGATIVE

## 2015-01-07 LAB — URINE CULTURE: Colony Count: 9000

## 2015-01-08 ENCOUNTER — Other Ambulatory Visit: Payer: Self-pay | Admitting: Women's Health

## 2015-01-08 ENCOUNTER — Encounter: Payer: Self-pay | Admitting: Women's Health

## 2015-01-08 MED ORDER — FLUCONAZOLE 150 MG PO TABS: 150.0000 mg | ORAL_TABLET | Freq: Once | ORAL | Status: DC

## 2015-01-09 ENCOUNTER — Encounter: Payer: Self-pay | Admitting: Women's Health

## 2015-02-16 ENCOUNTER — Ambulatory Visit: Payer: Commercial Managed Care - PPO | Admitting: Gynecology

## 2015-02-18 ENCOUNTER — Ambulatory Visit: Payer: Commercial Managed Care - PPO | Admitting: Gynecology

## 2015-02-24 ENCOUNTER — Encounter: Payer: Self-pay | Admitting: Women's Health

## 2015-02-24 ENCOUNTER — Ambulatory Visit (INDEPENDENT_AMBULATORY_CARE_PROVIDER_SITE_OTHER): Payer: Commercial Managed Care - PPO | Admitting: Women's Health

## 2015-02-24 VITALS — BP 126/80 | Ht 65.0 in | Wt 165.0 lb

## 2015-02-24 DIAGNOSIS — A499 Bacterial infection, unspecified: Secondary | ICD-10-CM | POA: Diagnosis not present

## 2015-02-24 DIAGNOSIS — B373 Candidiasis of vulva and vagina: Secondary | ICD-10-CM

## 2015-02-24 DIAGNOSIS — N760 Acute vaginitis: Secondary | ICD-10-CM

## 2015-02-24 DIAGNOSIS — R350 Frequency of micturition: Secondary | ICD-10-CM | POA: Diagnosis not present

## 2015-02-24 DIAGNOSIS — B3731 Acute candidiasis of vulva and vagina: Secondary | ICD-10-CM

## 2015-02-24 DIAGNOSIS — B9689 Other specified bacterial agents as the cause of diseases classified elsewhere: Secondary | ICD-10-CM

## 2015-02-24 LAB — URINALYSIS W MICROSCOPIC + REFLEX CULTURE
BILIRUBIN URINE: NEGATIVE
CASTS: NONE SEEN [LPF]
Crystals: NONE SEEN [HPF]
Glucose, UA: NEGATIVE
NITRITE: NEGATIVE
SPECIFIC GRAVITY, URINE: 1.025 (ref 1.001–1.035)
Yeast: NONE SEEN [HPF]
pH: 5.5 (ref 5.0–8.0)

## 2015-02-24 LAB — WET PREP FOR TRICH, YEAST, CLUE: Yeast Wet Prep HPF POC: NONE SEEN

## 2015-02-24 MED ORDER — METRONIDAZOLE 500 MG PO TABS: 500.0000 mg | ORAL_TABLET | Freq: Two times a day (BID) | ORAL | Status: DC

## 2015-02-24 MED ORDER — FLUCONAZOLE 150 MG PO TABS: 150.0000 mg | ORAL_TABLET | Freq: Once | ORAL | Status: DC

## 2015-02-24 NOTE — Patient Instructions (Signed)
Trichomoniasis °Trichomoniasis is an infection caused by an organism called Trichomonas. The infection can affect both women and men. In women, the outer female genitalia and the vagina are affected. In men, the penis is mainly affected, but the prostate and other reproductive organs can also be involved. Trichomoniasis is a sexually transmitted infection (STI) and is most often passed to another person through sexual contact.  °RISK FACTORS °· Having unprotected sexual intercourse. °· Having sexual intercourse with an infected partner. °SIGNS AND SYMPTOMS  °Symptoms of trichomoniasis in women include: °· Abnormal gray-green frothy vaginal discharge. °· Itching and irritation of the vagina. °· Itching and irritation of the area outside the vagina. °Symptoms of trichomoniasis in men include:  °· Penile discharge with or without pain. °· Pain during urination. This results from inflammation of the urethra. °DIAGNOSIS  °Trichomoniasis may be found during a Pap test or physical exam. Your health care provider may use one of the following methods to help diagnose this infection: °· Testing the pH of the vagina with a test tape. °· Using a vaginal swab test that checks for the Trichomonas organism. A test is available that provides results within a few minutes. °· Examining a urine sample. °· Testing vaginal secretions. °Your health care provider may test you for other STIs, including HIV. °TREATMENT  °· You may be given medicine to fight the infection. Women should inform their health care provider if they could be or are pregnant. Some medicines used to treat the infection should not be taken during pregnancy. °· Your health care provider may recommend over-the-counter medicines or creams to decrease itching or irritation. °· Your sexual partner will need to be treated if infected. °· Your health care provider may test you for infection again 3 months after treatment. °HOME CARE INSTRUCTIONS  °· Take medicines only as  directed by your health care provider. °· Take over-the-counter medicine for itching or irritation as directed by your health care provider. °· Do not have sexual intercourse while you have the infection. °· Women should not douche or wear tampons while they have the infection. °· Discuss your infection with your partner. Your partner may have gotten the infection from you, or you may have gotten it from your partner. °· Have your sex partner get examined and treated if necessary. °· Practice safe, informed, and protected sex. °· See your health care provider for other STI testing. °SEEK MEDICAL CARE IF:  °· You still have symptoms after you finish your medicine. °· You develop abdominal pain. °· You have pain when you urinate. °· You have bleeding after sexual intercourse. °· You develop a rash. °· Your medicine makes you sick or makes you throw up (vomit). °MAKE SURE YOU: °· Understand these instructions. °· Will watch your condition. °· Will get help right away if you are not doing well or get worse. °  °This information is not intended to replace advice given to you by your health care provider. Make sure you discuss any questions you have with your health care provider. °  °Document Released: 07/20/2000 Document Revised: 02/14/2014 Document Reviewed: 11/05/2012 °Elsevier Interactive Patient Education ©2016 [employer] ° °Bacterial Vaginosis °Bacterial vaginosis is a vaginal infection that occurs when the normal balance of bacteria in the vagina is disrupted. It results from an overgrowth of certain bacteria. This is the most common vaginal infection in women of childbearing age. Treatment is important to prevent complications, especially in pregnant women, as it can cause a premature delivery. °CAUSES  °  Bacterial vaginosis is caused by an increase in harmful bacteria that are normally present in smaller amounts in the vagina. Several different kinds of bacteria can cause bacterial vaginosis. However, the  reason that the condition develops is not fully understood. °RISK FACTORS °Certain activities or behaviors can put you at an increased risk of developing bacterial vaginosis, including: °· Having a new sex partner or multiple sex partners. °· Douching. °· Using an intrauterine device (IUD) for contraception. °Women do not get bacterial vaginosis from toilet seats, bedding, swimming pools, or contact with objects around them. °SIGNS AND SYMPTOMS  °Some women with bacterial vaginosis have no signs or symptoms. Common symptoms include: °· Grey vaginal discharge. °· A fishlike odor with discharge, especially after sexual intercourse. °· Itching or burning of the vagina and vulva. °· Burning or pain with urination. °DIAGNOSIS  °Your health care provider will take a medical history and examine the vagina for signs of bacterial vaginosis. A sample of vaginal fluid may be taken. Your health care provider will look at this sample under a microscope to check for bacteria and abnormal cells. A vaginal pH test may also be done.  °TREATMENT  °Bacterial vaginosis may be treated with antibiotic medicines. These may be given in the form of a pill or a vaginal cream. A second round of antibiotics may be prescribed if the condition comes back after treatment. Because bacterial vaginosis increases your risk for sexually transmitted diseases, getting treated can help reduce your risk for chlamydia, gonorrhea, HIV, and herpes. °HOME CARE INSTRUCTIONS  °· Only take over-the-counter or prescription medicines as directed by your health care provider. °· If antibiotic medicine was prescribed, take it as directed. Make sure you finish it even if you start to feel better. °· Tell all sexual partners that you have a vaginal infection. They should see their health care provider and be treated if they have problems, such as a mild rash or itching. °· During treatment, it is important that you follow these instructions: °¨ Avoid sexual activity  or use condoms correctly. °¨ Do not douche. °¨ Avoid alcohol as directed by your health care provider. °¨ Avoid breastfeeding as directed by your health care provider. °SEEK MEDICAL CARE IF:  °· Your symptoms are not improving after 3 days of treatment. °· You have increased discharge or pain. °· You have a fever. °MAKE SURE YOU:  °· Understand these instructions. °· Will watch your condition. °· Will get help right away if you are not doing well or get worse. °FOR MORE INFORMATION  °Centers for Disease Control and Prevention, Division of STD Prevention: www.cdc.gov/std °American Sexual Health Association (ASHA): www.ashastd.org  °  °This information is not intended to replace advice given to you by your health care provider. Make sure you discuss any questions you have with your health care provider. °  °Document Released: 01/24/2005 Document Revised: 02/14/2014 Document Reviewed: 09/05/2012 °Elsevier Interactive Patient Education ©2016 Elsevier Inc. ° °

## 2015-02-24 NOTE — Progress Notes (Signed)
Patient ID: Krystal [name], female   DOB: [birth year], 25 y.o.   MRN: [id number] Presents with complaint of increased vaginal discharge with itching, odor and irritation. States symptoms did get somewhat better after office visit 01/05/2015 but have never resolved. Has not been sexually active since,  negative STD screen. Denies urinary symptoms, abdominal pain or fever. Monthly cycle/condoms. Has received second gardasil at place of employment and will receive third one in April.  Exam: Appears well. External genitalia erythematous at introitus, speculum exam moderate amount of a white adherent discharge with odor, wet prep positive for many clues, moderate Trichomonas, TNTC bacteria.  Trichomonas  Plan: Flagyl 2 g by mouth for both her and partner. Alcohol precautions reviewed. Diflucan 150 by mouth 1 dose per request. Instructed to call if no relief of symptoms. Contraception options reviewed and declined will continue condoms.

## 2015-02-24 NOTE — Addendum Note (Signed)
Addended by: Kem Parkinson on: 02/24/2015 09:35 AM   Modules accepted: Orders

## 2015-02-25 LAB — URINE CULTURE: Colony Count: 50000

## 2015-03-07 ENCOUNTER — Encounter: Payer: Self-pay | Admitting: Women's Health

## 2015-05-30 IMAGING — CR DG ANKLE COMPLETE 3+V*L*
3 series · 3 of 3 positions shown · non-contrast
Comparison: None.

CLINICAL DATA: Ankle injury, lateral pain

EXAM:
LEFT ANKLE COMPLETE - 3+ VIEW

[view not recorded (1 of 3)]
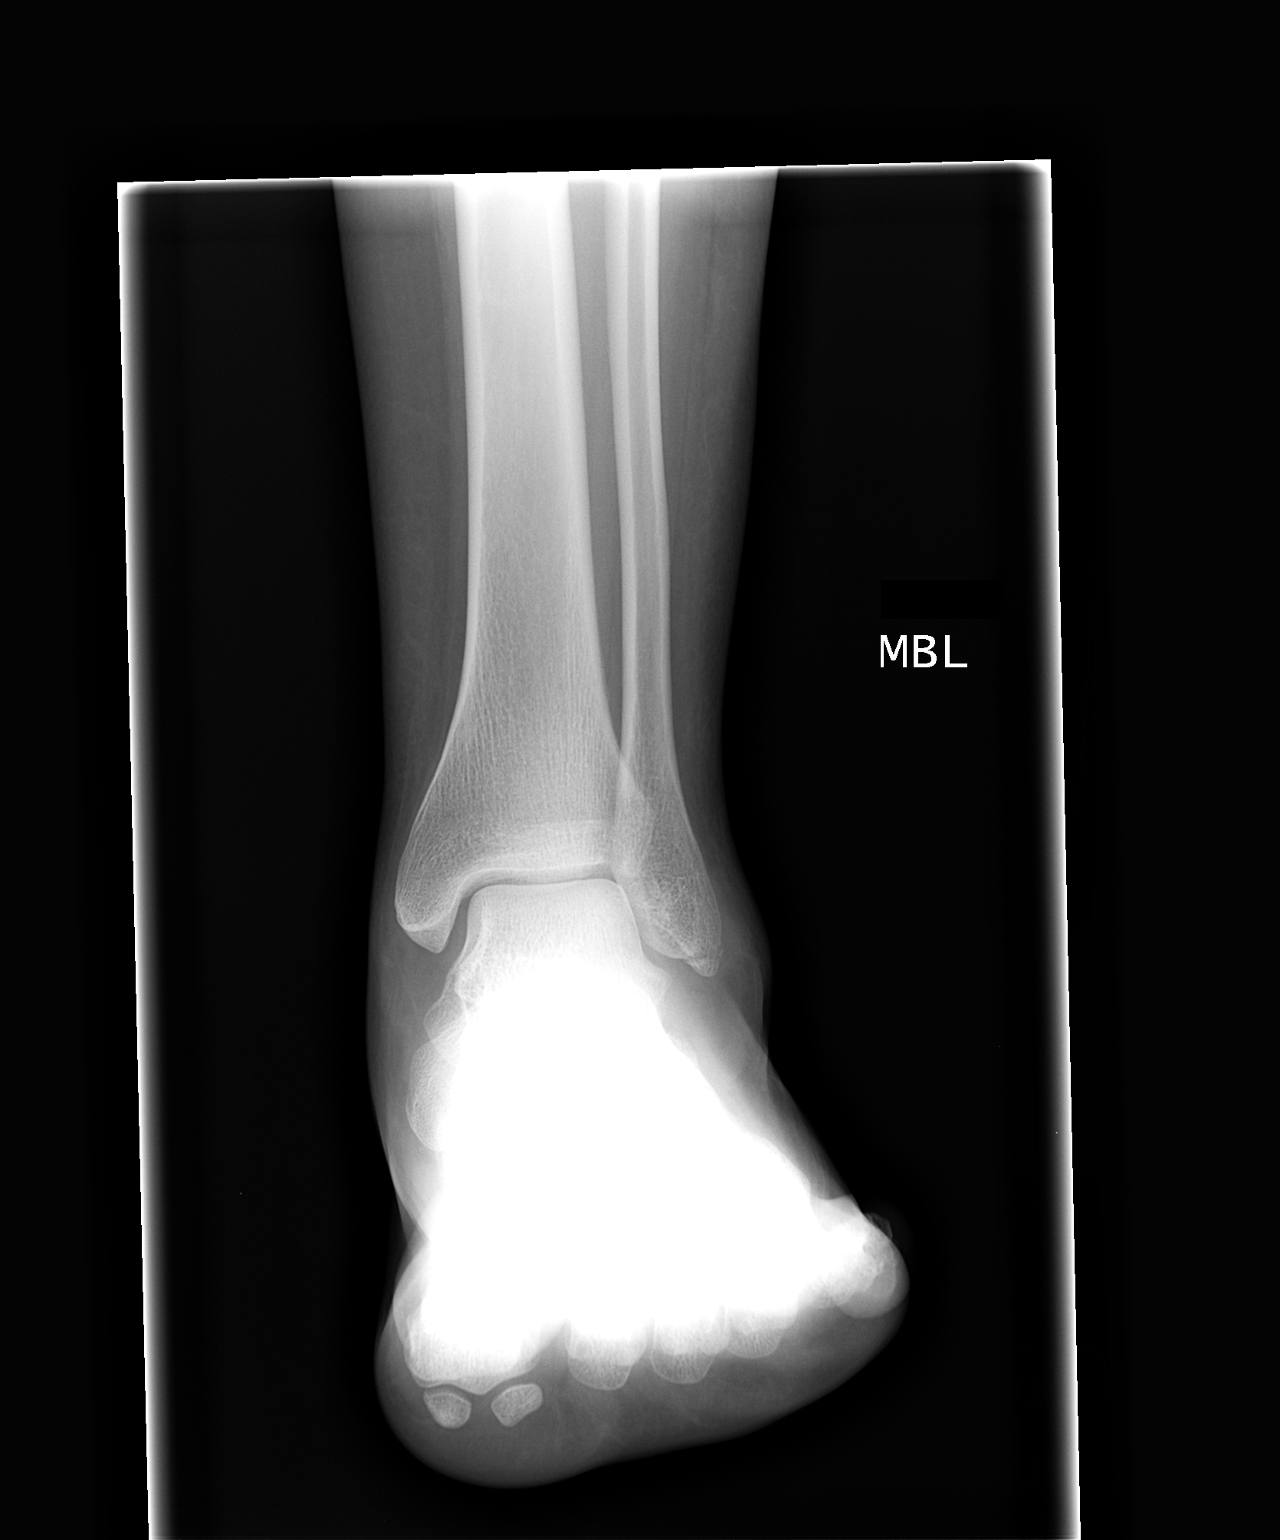

[view not recorded (2 of 3)]
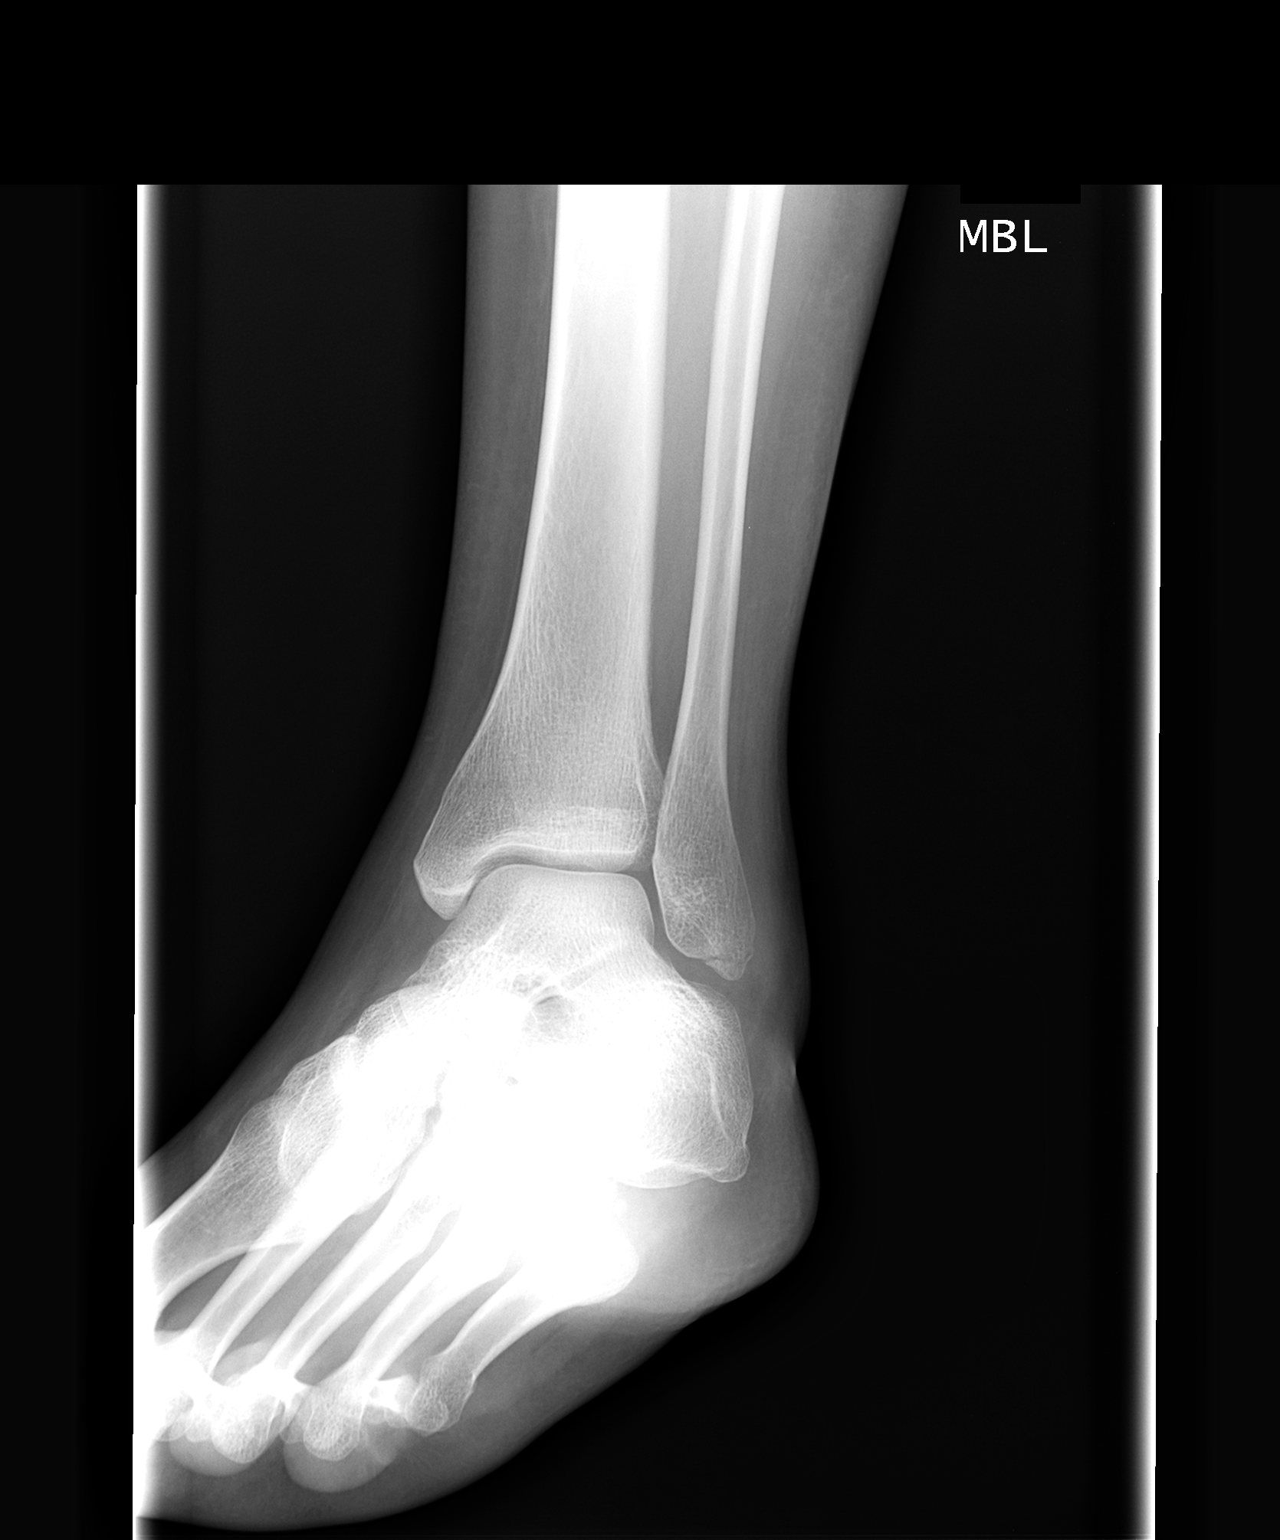

[view not recorded (3 of 3)]
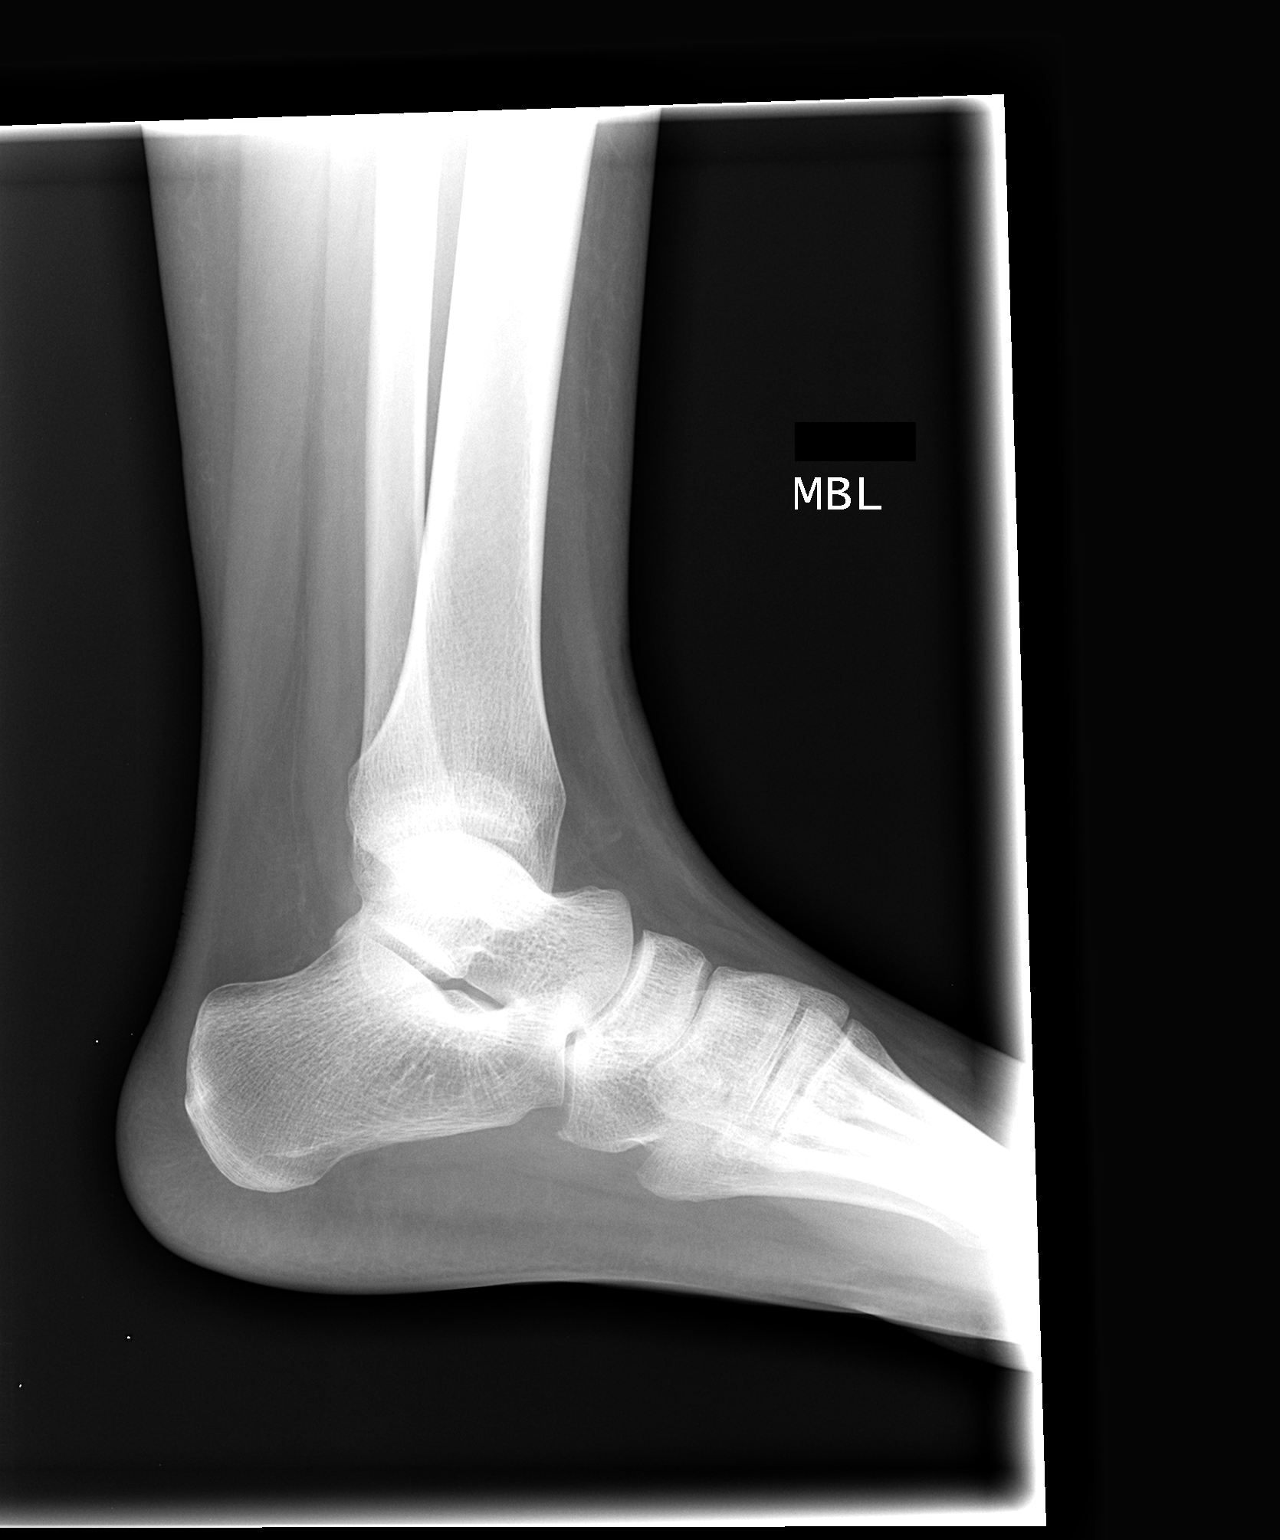

[3 of 3 positions shown; findings below may reference images not displayed]

FINDINGS: Acute nondisplaced lateral malleolar avulsion fracture noted.
Lateral soft tissue swelling. Normal ankle alignment. No effusion.
Left distal tibia, talus and calcaneus intact.
IMPRESSION: Nondisplaced acute lateral malleolar fracture

## 2016-01-04 ENCOUNTER — Encounter: Payer: Self-pay | Admitting: Gynecology

## 2016-01-04 ENCOUNTER — Ambulatory Visit (INDEPENDENT_AMBULATORY_CARE_PROVIDER_SITE_OTHER): Payer: 59 | Admitting: Gynecology

## 2016-01-04 VITALS — BP 116/72 | Ht 65.5 in | Wt 165.6 lb

## 2016-01-04 DIAGNOSIS — Z1151 Encounter for screening for human papillomavirus (HPV): Secondary | ICD-10-CM

## 2016-01-04 DIAGNOSIS — Z87410 Personal history of cervical dysplasia: Secondary | ICD-10-CM

## 2016-01-04 DIAGNOSIS — N760 Acute vaginitis: Secondary | ICD-10-CM

## 2016-01-04 DIAGNOSIS — Z01411 Encounter for gynecological examination (general) (routine) with abnormal findings: Secondary | ICD-10-CM | POA: Diagnosis not present

## 2016-01-04 DIAGNOSIS — Z113 Encounter for screening for infections with a predominantly sexual mode of transmission: Secondary | ICD-10-CM | POA: Diagnosis not present

## 2016-01-04 DIAGNOSIS — B9689 Other specified bacterial agents as the cause of diseases classified elsewhere: Secondary | ICD-10-CM | POA: Diagnosis not present

## 2016-01-04 DIAGNOSIS — N898 Other specified noninflammatory disorders of vagina: Secondary | ICD-10-CM

## 2016-01-04 LAB — WET PREP FOR TRICH, YEAST, CLUE
TRICH WET PREP: NONE SEEN
Yeast Wet Prep HPF POC: NONE SEEN

## 2016-01-04 MED ORDER — METRONIDAZOLE 500 MG PO TABS: 500.0000 mg | ORAL_TABLET | Freq: Two times a day (BID) | ORAL | 0 refills | Status: AC

## 2016-01-04 MED ORDER — METRONIDAZOLE 0.75 % VA GEL: VAGINAL | 5 refills | Status: AC

## 2016-01-04 NOTE — Addendum Note (Signed)
Addended by: Berna SpareASTILLO, BLANCA A on: 01/04/2016 08:54 AM   Modules accepted: Orders

## 2016-01-04 NOTE — Progress Notes (Signed)
Krystal [name] 1990/12/03 [id number]   History:    25 y.o.  for annual gyn exam with a complaint of a vaginal odor. Patient states that she has had bacterial vaginosis in the past and typically occurs right after her menses when she has the vaginal odor. She is using condoms for contraception. She would like to have an STD screen as well. Her past dysplasia history as follows: 2015 she had a Pap smear that demonstrated ASCUS positive HPV but negative for the high risk HPV 16, 18, and 45. 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 Her follow-up Pap smear in 2016 was normal but positive for HPV but negative for high risk HPV 16, 18, and 45. Patient had colposcopy once again no lesions [facility] which was negative. She reports normal menstrual cycles otherwise. Her flu vaccines up-to-date.  Past medical history,surgical history, family history and social history were all reviewed and documented in the EPIC chart.  Gynecologic History Patient's last menstrual period was 12/12/2015. Contraception: condoms Last Pap: See above. Results were: See above Last mammogram: Not indicated. Results were: Not indicated  Obstetric History OB History  Gravida Para Term Preterm AB Living  0            SAB TAB Ectopic Multiple Live Births                    ROS: A ROS was performed and pertinent positives and negatives are included in the history.  GENERAL: No fevers or chills. HEENT: No change in vision, no earache, sore throat or sinus congestion. NECK: No pain or stiffness. CARDIOVASCULAR: No chest pain or pressure. No palpitations. PULMONARY: No shortness of breath, cough or wheeze. GASTROINTESTINAL: No abdominal pain, nausea, vomiting or diarrhea, melena or bright red blood per rectum. GENITOURINARY: No urinary frequency, urgency, hesitancy or dysuria. MUSCULOSKELETAL: No joint or muscle pain, no back pain, no  recent trauma. DERMATOLOGIC: No rash, no itching, no lesions. ENDOCRINE: No polyuria, polydipsia, no heat or cold intolerance. No recent change in weight. HEMATOLOGICAL: No anemia or easy bruising or bleeding. NEUROLOGIC: No headache, seizures, numbness, tingling or weakness. PSYCHIATRIC: No depression, no loss of interest in normal activity or change in sleep pattern. Dr. Lurene ShadowBallen endocrinologist has been following patient for her hyperthyroidism for which she is currently on PTU   Exam: chaperone present  BP 116/72   Ht 5' 5.5" (1.664 m)   Wt 165 lb 9.6 oz (75.1 kg)   LMP 12/12/2015   BMI 27.14 kg/m   Body mass index is 27.14 kg/m.  General appearance : Well developed well nourished female. No acute distress HEENT: Eyes: no retinal hemorrhage or exudates,  Neck supple, trachea midline, no carotid bruits, no thyroidmegaly Lungs: Clear to auscultation, no rhonchi or wheezes, or rib retractions  Heart: Regular rate and rhythm, no murmurs or gallops Breast:Examined in sitting and supine position were symmetrical in appearance, no palpable masses or tenderness,  no skin retraction, no nipple inversion, no nipple discharge, no skin discoloration, no axillary or supraclavicular lymphadenopathy Abdomen: no palpable masses or tenderness, no rebound or guarding Extremities: no edema or skin discoloration or tenderness  Pelvic:  Bartholin, Urethra, Skene Glands: Within normal limits             Vagina: No gross lesions or discharge  Cervix: No gross lesions or discharge  Uterus  anteverted, normal size, shape and consistency, non-tender  and mobile  Adnexa  Without masses or tenderness  Anus and perineum  normal   Rectovaginal  normal sphincter tone without palpated masses or tenderness             Hemoccult not indicated   Wet prep: Moderate clue cell, few WBC, too numerous to count bacteria  Assessment/Plan:  25 y.o. female for annual exam with past history of ASCUS and CIN-1 as described  above. Pap smear with HPV screening done today. Patient with recurrent BV we'll be treating the following fashion: Flagyl 500 mg twice a day for 7 days followed by MetroGel intravaginal twice a week for 6 months.Patient to follow with her medical endocrinologist in January because of her history of hyperthyroidism for which she's currently on PTU. The following fasting blood work was done today: Comprehensive metabolic panel, CBC, fasting lipid profile and urinalysis. As part of her STD screen GC and Chlamydia culture along with HIV, RPR, hepatitis B and C was obtained today.   Krystal EdwardsFERNANDEZ,Krystal [name], 8:48 AM 01/04/2016  Patient ID: Krystal [name], female   DOB: 1990-10-22, 25 y.o.   MRN: [id number]

## 2016-01-05 LAB — CBC WITH DIFFERENTIAL/PLATELET
BASOS PCT: 0 %
Basophils Absolute: 0 cells/uL (ref 0–200)
Eosinophils Absolute: 65 cells/uL (ref 15–500)
Eosinophils Relative: 1 %
HEMATOCRIT: 41.4 % (ref 35.0–45.0)
Hemoglobin: 13.3 g/dL (ref 11.7–15.5)
LYMPHS ABS: 2080 {cells}/uL (ref 850–3900)
LYMPHS PCT: 32 %
MCH: 29.1 pg (ref 27.0–33.0)
MCHC: 32.1 g/dL (ref 32.0–36.0)
MCV: 90.6 fL (ref 80.0–100.0)
MONO ABS: 390 {cells}/uL (ref 200–950)
MONOS PCT: 6 %
MPV: 10.1 fL (ref 7.5–12.5)
NEUTROS ABS: 3965 {cells}/uL (ref 1500–7800)
NEUTROS PCT: 61 %
PLATELETS: 328 10*3/uL (ref 140–400)
RBC: 4.57 MIL/uL (ref 3.80–5.10)
RDW: 12.9 % (ref 11.0–15.0)
WBC: 6.5 10*3/uL (ref 3.8–10.8)

## 2016-01-05 LAB — COMPREHENSIVE METABOLIC PANEL
ALT: 6 U/L (ref 6–29)
AST: 12 U/L (ref 10–30)
Albumin: 4.4 g/dL (ref 3.6–5.1)
Alkaline Phosphatase: 53 U/L (ref 33–115)
BUN: 13 mg/dL (ref 7–25)
CALCIUM: 9.1 mg/dL (ref 8.6–10.2)
CO2: 23 mmol/L (ref 20–31)
Chloride: 107 mmol/L (ref 98–110)
Creat: 0.74 mg/dL (ref 0.50–1.10)
GLUCOSE: 83 mg/dL (ref 65–99)
POTASSIUM: 3.7 mmol/L (ref 3.5–5.3)
Sodium: 138 mmol/L (ref 135–146)
Total Bilirubin: 0.4 mg/dL (ref 0.2–1.2)
Total Protein: 7.6 g/dL (ref 6.1–8.1)

## 2016-01-05 LAB — URINALYSIS W MICROSCOPIC + REFLEX CULTURE
Bacteria, UA: NONE SEEN [HPF]
Bilirubin Urine: NEGATIVE
CASTS: NONE SEEN [LPF]
CRYSTALS: NONE SEEN [HPF]
Glucose, UA: NEGATIVE
Hgb urine dipstick: NEGATIVE
Ketones, ur: NEGATIVE
Leukocytes, UA: NEGATIVE
Nitrite: NEGATIVE
Protein, ur: NEGATIVE
Specific Gravity, Urine: 1.026 (ref 1.001–1.035)
WBC, UA: NONE SEEN WBC/HPF (ref <=5)
YEAST: NONE SEEN [HPF]
pH: 5.5 (ref 5.0–8.0)

## 2016-01-05 LAB — HEPATITIS C ANTIBODY: HCV Ab: NEGATIVE

## 2016-01-05 LAB — LIPID PANEL
CHOL/HDL RATIO: 2.5 ratio (ref <5.0)
Cholesterol: 144 mg/dL (ref <200)
HDL: 57 mg/dL (ref >50)
LDL Cholesterol: 75 mg/dL (ref <100)
Triglycerides: 58 mg/dL (ref <150)
VLDL: 12 mg/dL (ref <30)

## 2016-01-05 LAB — HEPATITIS B SURFACE ANTIGEN: Hepatitis B Surface Ag: NEGATIVE

## 2016-01-05 LAB — GC/CHLAMYDIA PROBE AMP
CT Probe RNA: NOT DETECTED
GC Probe RNA: NOT DETECTED

## 2016-01-05 LAB — HIV ANTIBODY (ROUTINE TESTING W REFLEX): HIV: NONREACTIVE

## 2016-01-05 LAB — RPR

## 2016-01-06 LAB — URINE CULTURE: Organism ID, Bacteria: NO GROWTH

## 2016-01-07 ENCOUNTER — Encounter: Payer: Self-pay | Admitting: Gynecology

## 2016-01-07 LAB — PAP, TP IMAGING W/ HPV RNA, RFLX HPV TYPE 16,18/45: HPV MRNA, HIGH RISK: NOT DETECTED

## 2016-01-12 ENCOUNTER — Encounter: Payer: Self-pay | Admitting: Gynecology

## 2016-01-12 DIAGNOSIS — B373 Candidiasis of vulva and vagina: Secondary | ICD-10-CM

## 2016-01-12 DIAGNOSIS — B3731 Acute candidiasis of vulva and vagina: Secondary | ICD-10-CM

## 2016-01-13 MED ORDER — FLUCONAZOLE 150 MG PO TABS: 150.0000 mg | ORAL_TABLET | Freq: Once | ORAL | 0 refills | Status: AC

## 2016-02-25 ENCOUNTER — Encounter: Payer: Self-pay | Admitting: Gynecology

## 2016-02-26 ENCOUNTER — Other Ambulatory Visit: Payer: Self-pay | Admitting: *Deleted

## 2016-02-26 MED ORDER — FLUCONAZOLE 150 MG PO TABS: 150.0000 mg | ORAL_TABLET | Freq: Once | ORAL | 0 refills | Status: AC

## 2016-06-22 ENCOUNTER — Encounter: Payer: Self-pay | Admitting: Gynecology
